# Patient Record
Sex: Female | Born: 1970
Health system: Southern US, Community
[De-identification: ages and names within clinical notes are randomized; demographics above are authoritative.]

---

## 2014-07-08 ENCOUNTER — Ambulatory Visit: Payer: Self-pay | Admitting: Obstetrics and Gynecology

## 2016-06-12 ENCOUNTER — Other Ambulatory Visit: Payer: Self-pay | Admitting: Obstetrics and Gynecology

## 2016-06-12 DIAGNOSIS — F4322 Adjustment disorder with anxiety: Secondary | ICD-10-CM | POA: Diagnosis not present

## 2016-06-12 DIAGNOSIS — Z1231 Encounter for screening mammogram for malignant neoplasm of breast: Secondary | ICD-10-CM

## 2016-06-27 ENCOUNTER — Ambulatory Visit: Admission: RE | Admit: 2016-06-27 | Payer: BLUE CROSS/BLUE SHIELD | Source: Ambulatory Visit

## 2016-07-11 ENCOUNTER — Ambulatory Visit
Admission: RE | Admit: 2016-07-11 | Discharge: 2016-07-11 | Disposition: A | Discharge status: Home / Self Care | Payer: BLUE CROSS/BLUE SHIELD | Source: Ambulatory Visit | Attending: Obstetrics and Gynecology | Admitting: Obstetrics and Gynecology

## 2016-07-11 DIAGNOSIS — Z1231 Encounter for screening mammogram for malignant neoplasm of breast: Secondary | ICD-10-CM | POA: Insufficient documentation

## 2016-07-30 DIAGNOSIS — Z1329 Encounter for screening for other suspected endocrine disorder: Secondary | ICD-10-CM | POA: Diagnosis not present

## 2016-07-30 DIAGNOSIS — Z136 Encounter for screening for cardiovascular disorders: Secondary | ICD-10-CM | POA: Diagnosis not present

## 2016-07-30 DIAGNOSIS — I10 Essential (primary) hypertension: Secondary | ICD-10-CM | POA: Diagnosis not present

## 2016-07-30 DIAGNOSIS — Z13220 Encounter for screening for lipoid disorders: Secondary | ICD-10-CM | POA: Diagnosis not present

## 2016-07-30 DIAGNOSIS — Z131 Encounter for screening for diabetes mellitus: Secondary | ICD-10-CM | POA: Diagnosis not present

## 2017-02-07 DIAGNOSIS — I10 Essential (primary) hypertension: Secondary | ICD-10-CM | POA: Diagnosis not present

## 2017-03-26 ENCOUNTER — Telehealth: Payer: Self-pay

## 2017-03-26 NOTE — Telephone Encounter (Signed)
Taylor Barr DOB: [birth year] called to schedule his son Taylor MeyerMcIntyre "Ty" an appt with Dr. Sullivan LoneGilbert. I advised that I didn't show Ty as a pt and that Mr. Taylor Barr wasn't a current a pt. Mr. Taylor Barr stated that his wife is also a pt of Dr. Elisabeth Barr's Taylor Genera(Taylor Barr DOB: 07/16/1971). Mr. Taylor Barr is requesting that his son become a new pt and be seen today if possible for a sore throat and Mr. Taylor Barr would also like to re-establish himself. Insurance: BCBS. No medical conditions. Ty takes singular and Mr. Taylor Barr doesn't take any medications. Can we see Ty as a new pt today and re-establish Mr. Taylor Barr for another day? Please advise. Thanks Taylor Barr   Please see above. Taylor Barr was last seen in 2014 by you-aa

## 2017-03-26 NOTE — Telephone Encounter (Signed)
Ok for both

## 2017-07-31 DIAGNOSIS — I10 Essential (primary) hypertension: Secondary | ICD-10-CM | POA: Diagnosis not present

## 2017-07-31 DIAGNOSIS — Z1321 Encounter for screening for nutritional disorder: Secondary | ICD-10-CM | POA: Diagnosis not present

## 2017-07-31 DIAGNOSIS — Z124 Encounter for screening for malignant neoplasm of cervix: Secondary | ICD-10-CM | POA: Diagnosis not present

## 2017-07-31 DIAGNOSIS — Z131 Encounter for screening for diabetes mellitus: Secondary | ICD-10-CM | POA: Diagnosis not present

## 2017-07-31 DIAGNOSIS — Z01419 Encounter for gynecological examination (general) (routine) without abnormal findings: Secondary | ICD-10-CM | POA: Diagnosis not present

## 2017-07-31 DIAGNOSIS — Z136 Encounter for screening for cardiovascular disorders: Secondary | ICD-10-CM | POA: Diagnosis not present

## 2017-07-31 DIAGNOSIS — Z13220 Encounter for screening for lipoid disorders: Secondary | ICD-10-CM | POA: Diagnosis not present

## 2017-08-01 ENCOUNTER — Other Ambulatory Visit: Payer: Self-pay | Admitting: Obstetrics and Gynecology

## 2017-08-01 DIAGNOSIS — Z1231 Encounter for screening mammogram for malignant neoplasm of breast: Secondary | ICD-10-CM

## 2017-12-11 IMAGING — MG MM DIGITAL SCREENING BILAT W/ CAD
8 series · 8 of 8 positions shown · non-contrast
Comparison: Previous exam(s).

CLINICAL DATA: Screening.

EXAM:
DIGITAL SCREENING BILATERAL MAMMOGRAM WITH CAD

[L CC (1 of 3)]
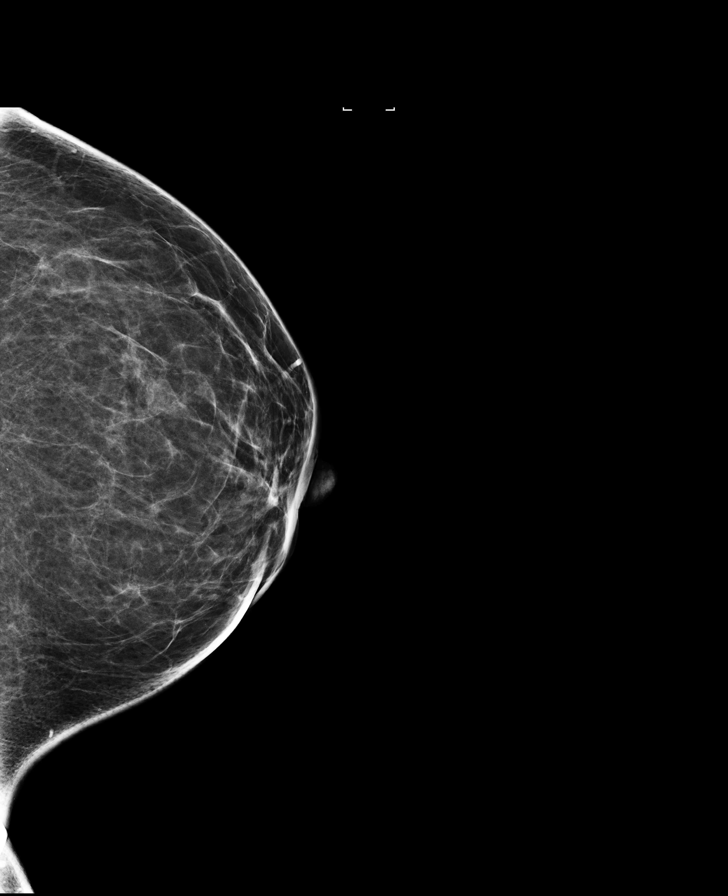

[R CC (1 of 3)]
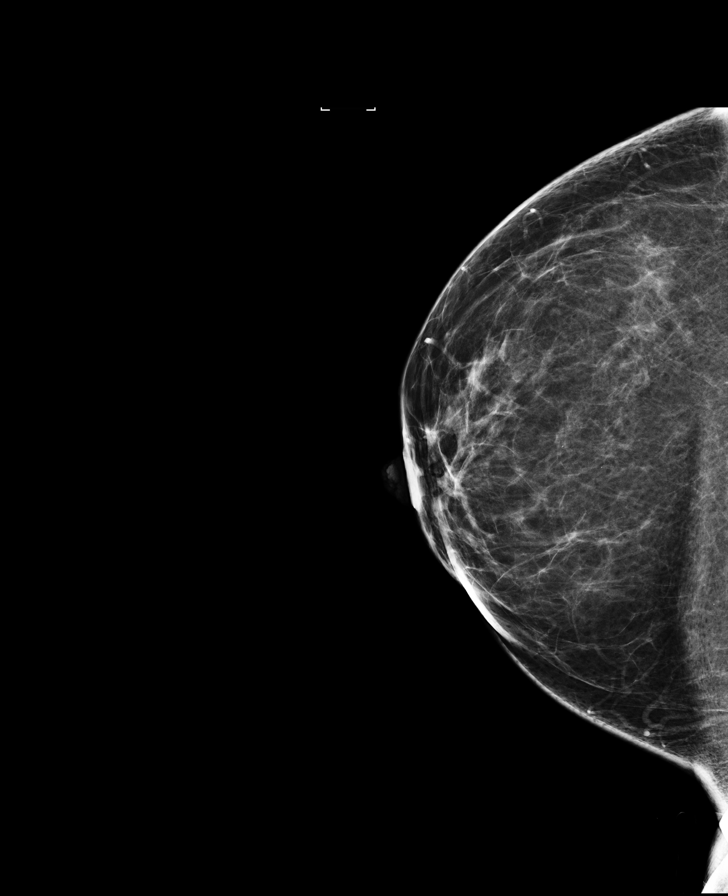

[L CC (2 of 3)]
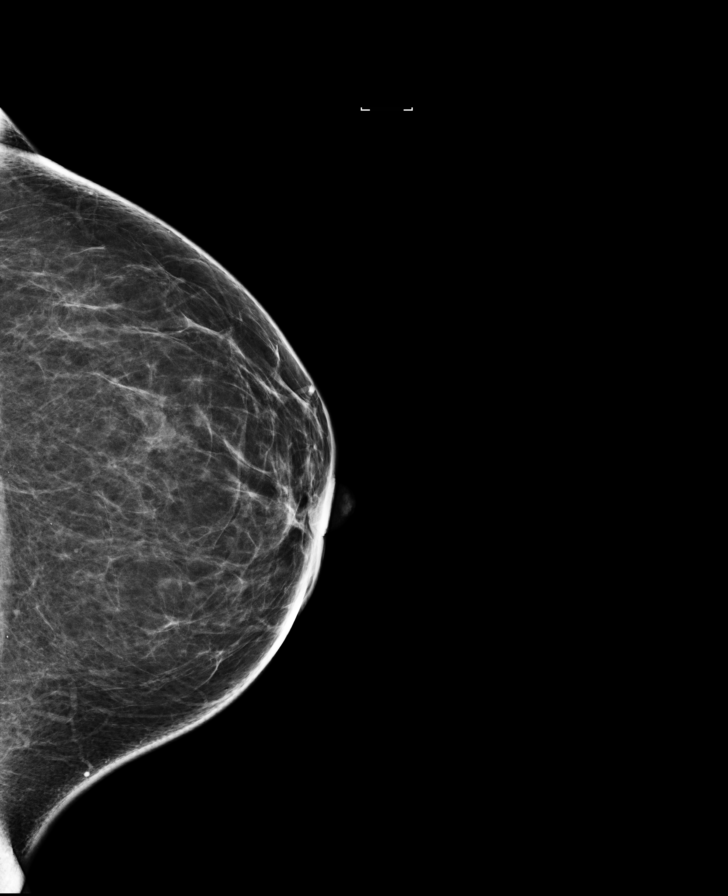

[L MLO]
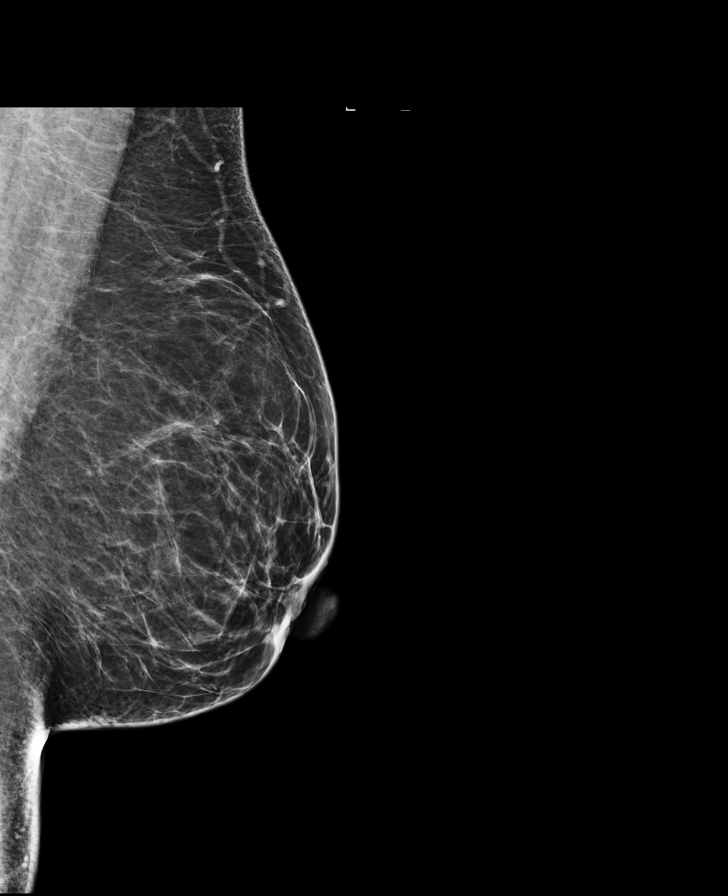

[L CC (3 of 3)]
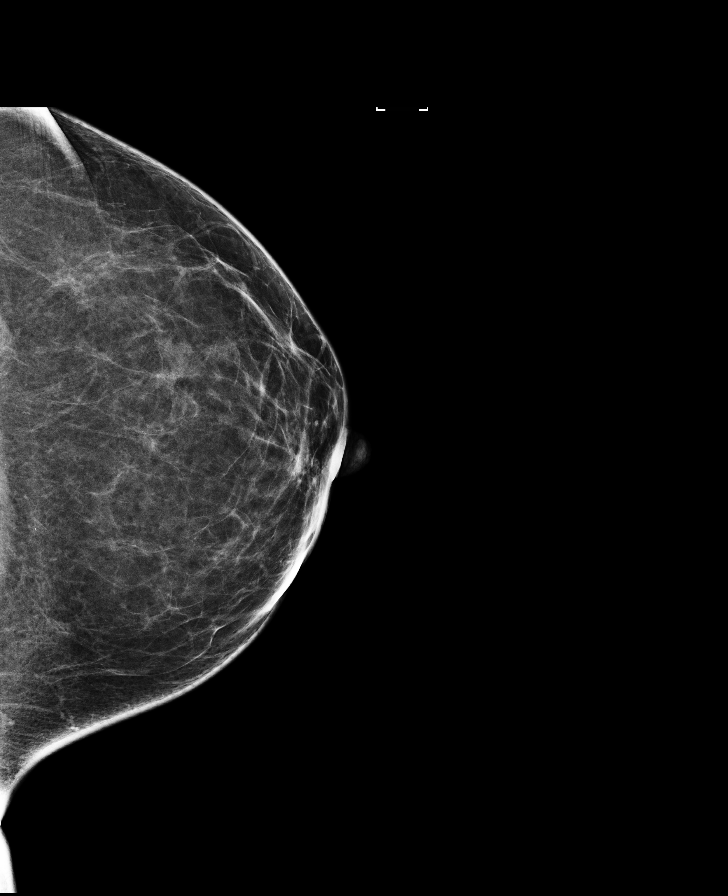

[R CC (2 of 3)]
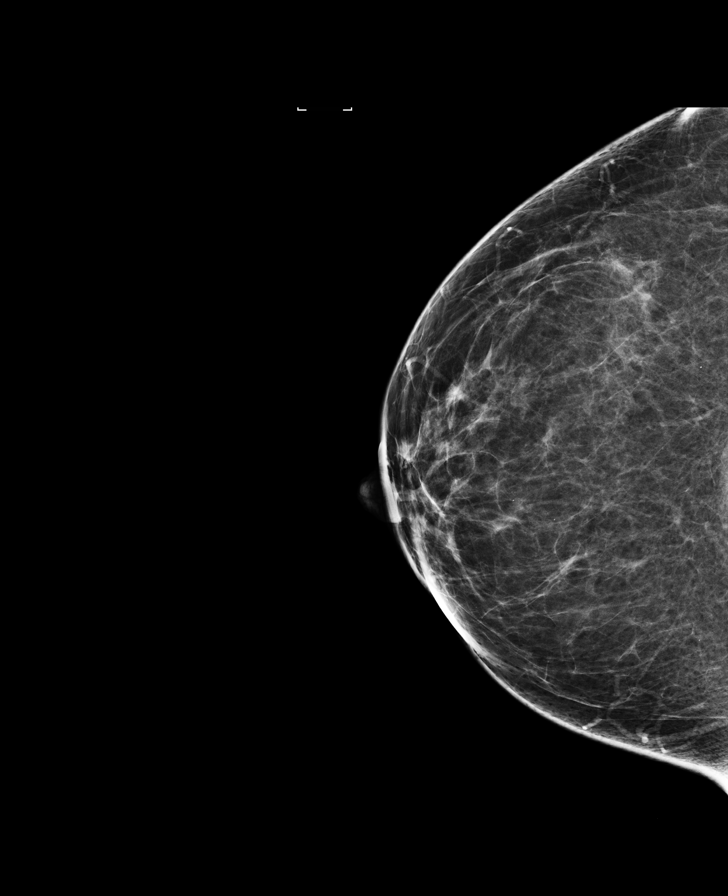

[R CC (3 of 3)]
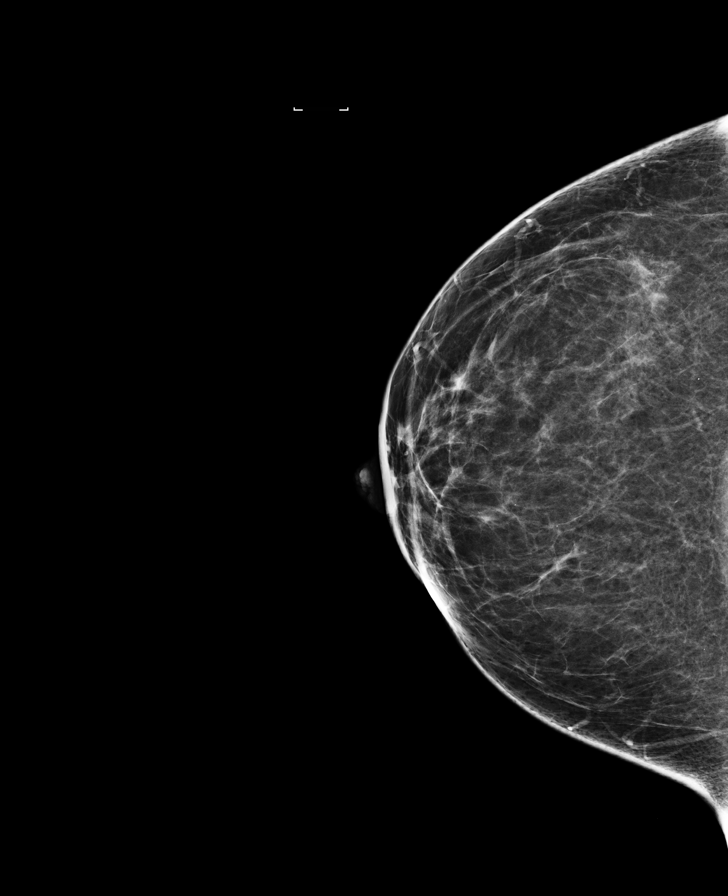

[R MLO]
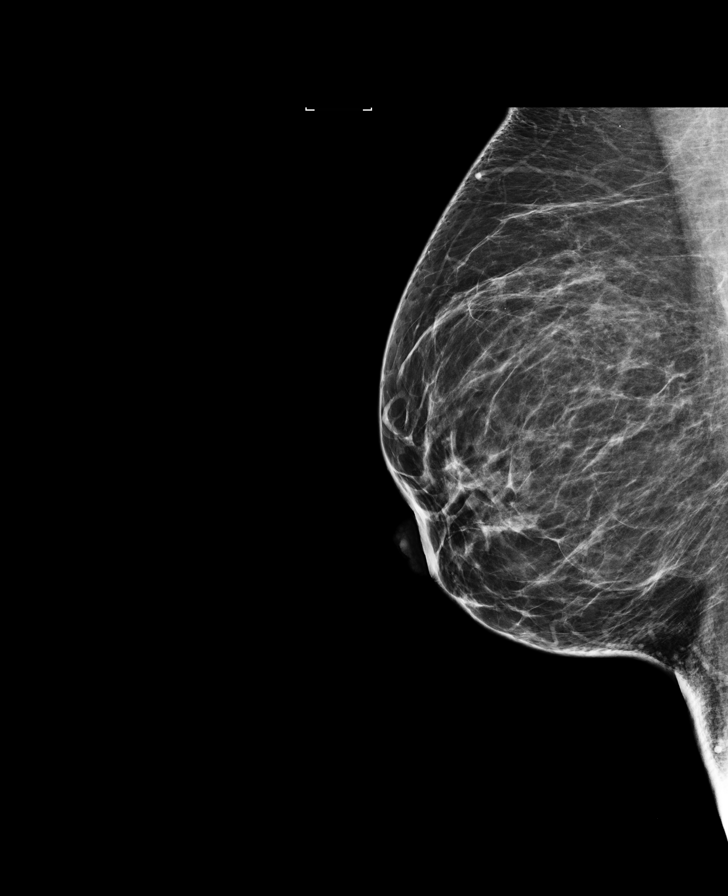

[8 of 8 positions shown; findings below may reference images not displayed]

ACR Breast Density Category b: There are scattered areas of
fibroglandular density.
FINDINGS: There are no findings suspicious for malignancy. Images were
processed with CAD.
IMPRESSION: No mammographic evidence of malignancy. A result letter of this
screening mammogram will be mailed directly to the patient.

RECOMMENDATION:
Screening mammogram in one year. (Code:AS-G-LCT)

BI-RADS CATEGORY  1: Negative.

## 2018-03-17 ENCOUNTER — Ambulatory Visit: Payer: BLUE CROSS/BLUE SHIELD | Admitting: Physician Assistant

## 2018-03-17 DIAGNOSIS — J029 Acute pharyngitis, unspecified: Secondary | ICD-10-CM

## 2018-03-17 NOTE — Patient Instructions (Signed)

## 2018-03-17 NOTE — Progress Notes (Addendum)
       Patient: Taylor Barr Female    DOB: 08/07/1971   [age] y.o.   MRN: [id number] Visit Date: 03/18/2018  Today's Provider: Trey SailorsAdriana M Pollak, PA-C   Chief Complaint  Patient presents with  . Sinusitis   Subjective:    Taylor Barr is a 47 y/o woman presenting today for one day of nasal and sinus congestion. She is a fifth grade teacher. She did not get flu shot. Also reports body aches. Has taken motrin with good relief.  Sinusitis  This is a new problem. The current episode started yesterday. The problem has been gradually worsening since onset. There has been no fever. Associated symptoms include congestion, ear pain, headaches, sinus pressure, a sore throat and swollen glands. Pertinent negatives include no chills, coughing, diaphoresis, hoarse voice, neck pain, shortness of breath or sneezing.       No Known Allergies   Current Outpatient Medications:  .  FLUoxetine (PROZAC) 40 MG capsule, Take 40 mg by mouth daily., Disp: , Rfl:  .  lisinopril-hydrochlorothiazide (PRINZIDE,ZESTORETIC) 20-25 MG tablet, Take 1 tablet by mouth every morning., Disp: , Rfl: 1 .  simvastatin (ZOCOR) 20 MG tablet, , Disp: , Rfl: 0  Review of Systems  Constitutional: Positive for fatigue. Negative for activity change, appetite change, chills, diaphoresis, fever and unexpected weight change.  HENT: Positive for congestion, ear pain, postnasal drip, sinus pressure, sinus pain and sore throat. Negative for ear discharge, hoarse voice, rhinorrhea, sneezing, tinnitus, trouble swallowing and voice change.   Eyes: Positive for discharge and itching. Negative for photophobia, pain, redness and visual disturbance.  Respiratory: Negative.  Negative for cough and shortness of breath.   Gastrointestinal: Negative.   Musculoskeletal: Negative for neck pain.  Neurological: Positive for headaches. Negative for dizziness and light-headedness.    Social History   Tobacco Use  . Smoking status: Not on file    Substance Use Topics  . Alcohol use: Not on file   Objective:   There were no vitals taken for this visit. There were no vitals filed for this visit.   Physical Exam  Constitutional: She is oriented to person, place, and time. She appears well-developed and well-nourished.  HENT:  Right Ear: Tympanic membrane and external ear normal.  Left Ear: Tympanic membrane and external ear normal.  Mouth/Throat: Oropharynx is clear and moist. No oropharyngeal exudate.  Eyes: Conjunctivae are normal.  Cardiovascular: Normal rate and regular rhythm.  Pulmonary/Chest: Effort normal and breath sounds normal.  Lymphadenopathy:    She has cervical adenopathy.  Neurological: She is alert and oriented to person, place, and time.  Skin: Skin is warm and dry.  Psychiatric: She has a normal mood and affect. Her behavior is normal.        Assessment & Plan:     1. Pharyngitis, unspecified etiology  Rapid flu negative. Likely viral in etiology. Counseled on duration of viral illness and indication for antibiotics for bacterial sinusitis. Counseled OTC tx. She may call back on 03/24/2018 if she is worsening for antibiotics.  - POCT Influenza A/B  Return if symptoms worsen or fail to improve.  The entirety of the information documented in the History of Present Illness, Review of Systems and Physical Exam were personally obtained by me. Portions of this information were initially documented by Kavin LeechLaura Walsh, CMA and reviewed by me for thoroughness and accuracy.         Trey SailorsAdriana M Pollak, PA-C  [facility]

## 2018-03-18 LAB — POCT INFLUENZA A/B
Influenza A, POC: NEGATIVE
Influenza B, POC: NEGATIVE

## 2018-03-19 ENCOUNTER — Telehealth: Payer: Self-pay

## 2018-03-19 NOTE — Telephone Encounter (Signed)
I instructed her that viral illness is 10 days. Her symptoms stated Sunday, clinic visit on Monday, and instructed her to call back 03/24/2018. This is still in the viral window and abx is not appropriate.

## 2018-03-19 NOTE — Telephone Encounter (Signed)
Noted, thank you for speaking with this patient.

## 2018-03-19 NOTE — Telephone Encounter (Signed)
Patient advised as directed below. Per patient this is ridiculous and requested to see Dr. Sullivan LoneGilbert.Advised patient Dr. Sullivan LoneGilbert is done this afternoon. 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

## 2018-03-19 NOTE — Telephone Encounter (Signed)
Patient is calling that she saw Adriana on Monday and was told that if she wasn't feeling any better to call back and that she send an antibiotic.Patient reports she is not better. [facility] [place]  Thanks,  -Floy Riegler

## 2018-03-21 DIAGNOSIS — J0140 Acute pansinusitis, unspecified: Secondary | ICD-10-CM | POA: Diagnosis not present

## 2018-05-29 DIAGNOSIS — E559 Vitamin D deficiency, unspecified: Secondary | ICD-10-CM | POA: Diagnosis not present

## 2018-05-29 DIAGNOSIS — F4323 Adjustment disorder with mixed anxiety and depressed mood: Secondary | ICD-10-CM | POA: Diagnosis not present

## 2019-02-03 DIAGNOSIS — Z131 Encounter for screening for diabetes mellitus: Secondary | ICD-10-CM | POA: Diagnosis not present

## 2019-02-03 DIAGNOSIS — Z1321 Encounter for screening for nutritional disorder: Secondary | ICD-10-CM | POA: Diagnosis not present

## 2019-02-03 DIAGNOSIS — Z136 Encounter for screening for cardiovascular disorders: Secondary | ICD-10-CM | POA: Diagnosis not present

## 2019-02-03 DIAGNOSIS — I10 Essential (primary) hypertension: Secondary | ICD-10-CM | POA: Diagnosis not present

## 2019-06-04 DIAGNOSIS — I10 Essential (primary) hypertension: Secondary | ICD-10-CM | POA: Diagnosis not present

## 2019-06-04 DIAGNOSIS — Z131 Encounter for screening for diabetes mellitus: Secondary | ICD-10-CM | POA: Diagnosis not present

## 2019-06-04 DIAGNOSIS — F4323 Adjustment disorder with mixed anxiety and depressed mood: Secondary | ICD-10-CM | POA: Diagnosis not present

## 2019-06-04 DIAGNOSIS — Z1321 Encounter for screening for nutritional disorder: Secondary | ICD-10-CM | POA: Diagnosis not present

## 2019-06-04 DIAGNOSIS — Z136 Encounter for screening for cardiovascular disorders: Secondary | ICD-10-CM | POA: Diagnosis not present

## 2019-07-09 DIAGNOSIS — L0100 Impetigo, unspecified: Secondary | ICD-10-CM | POA: Diagnosis not present

## 2019-09-28 NOTE — Progress Notes (Signed)
Patient: Taylor Barr Female    DOB: Jan 11, 1971   48 y.o.   MRN: [id number] Visit Date: 09/29/2019  Today's Provider: Megan Mans, MD   No chief complaint on file.  Subjective:   HPI Patient comes in today c/o numbness and tingling in both hands for the last week. She has been using wrist braces, ice, and Ibuprofen. LOV with PCP was in 2014.  She sleeps with her hands and arms in an awkward position with them tightly palmar flexed.  She wakes up with her hands numb and even a part of her arms numb.  She states she feels a little weaker in the hands with opening jars but otherwise is not dropping anything.  She points to the index middle and ring finger and the thumb when asked where the numbness is.  She just over the weekend started wearing a splint.  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  Her children are now senior and sophomore in high school.  She and her husband are doing well. No Known Allergies   Current Outpatient Medications:  .  escitalopram (LEXAPRO) 20 MG tablet, Take 20 mg by mouth daily., Disp: , Rfl:  .  lisinopril-hydrochlorothiazide (PRINZIDE,ZESTORETIC) 20-25 MG tablet, Take 1 tablet by mouth every morning., Disp: , Rfl: 1 .  simvastatin (ZOCOR) 20 MG tablet, , Disp: , Rfl: 0 .  FLUoxetine (PROZAC) 40 MG capsule, Take 40 mg by mouth daily., Disp: , Rfl:   Review of Systems  Constitutional: Negative.   Respiratory: Negative.   Cardiovascular: Negative.   Gastrointestinal: Negative.   Allergic/Immunologic: Negative.   Neurological:       Numbness and tingling in hands.  Hematological: Negative.   Psychiatric/Behavioral: Negative.     Social History   Tobacco Use  . Smoking status: Not on file  Substance Use Topics  . Alcohol use: Not on file      Objective:   BP 128/80 (BP Location: Right Arm, Patient Position: Sitting, Cuff Size: Large)   Pulse 98   Temp  (!) 96.6 F (35.9 C) (Temporal)   Resp 18   Wt 215 lb (97.5 kg)   LMP 09/10/2019 (LMP Unknown)   SpO2 97%  Vitals:   09/29/19 0828  BP: 128/80  Pulse: 98  Resp: 18  Temp: (!) 96.6 F (35.9 C)  TempSrc: Temporal  SpO2: 97%  Weight: 215 lb (97.5 kg)  There is no height or weight on file to calculate BMI.   Physical Exam Vitals signs reviewed.  Constitutional:      Appearance: She is obese.  HENT:     Head: Normocephalic and atraumatic.     Right Ear: External ear normal.     Left Ear: External ear normal.  Cardiovascular:     Rate and Rhythm: Normal rate and regular rhythm.     Heart sounds: Normal heart sounds.  Pulmonary:     Effort: Pulmonary effort is normal.  Neurological:     Mental Status: She is alert and oriented to person, place, and time.     Comments: Positive Tinel sign bilaterally.  Grip is normal bilaterally  Psychiatric:        Mood and Affect: Mood normal.        Behavior: Behavior normal.        Thought Content: Thought content normal.        Judgment: Judgment normal.  No results found for any visits on 09/29/19.     Assessment & Plan    1. Carpal tunnel syndrome of right wrist  - naproxen (NAPROSYN) 500 MG tablet; Take 1 tablet (500 mg total) by mouth 2 (two) times daily with a meal.  Dispense: 30 tablet; Refill: 0  2. Carpal tunnel syndrome on both sides Right greater than left.  Use cock-up wrist splint most of the 24 hours/day.  Try naproxen 500 mg twice daily and recheck in 3 weeks.  Take this with food.  Stop over-the-counter ibuprofen.  May take as needed Tylenol.  May need referral to hand surgery.      Cranford Mon, MD  [facility]

## 2019-09-29 ENCOUNTER — Ambulatory Visit: Payer: BC Managed Care – PPO | Admitting: Family Medicine

## 2019-09-29 ENCOUNTER — Other Ambulatory Visit: Payer: Self-pay

## 2019-09-29 VITALS — BP 128/80 | HR 98 | Temp 96.6°F | Resp 18 | Wt 215.0 lb

## 2019-09-29 DIAGNOSIS — G5603 Carpal tunnel syndrome, bilateral upper limbs: Secondary | ICD-10-CM | POA: Diagnosis not present

## 2019-09-29 DIAGNOSIS — G5601 Carpal tunnel syndrome, right upper limb: Secondary | ICD-10-CM | POA: Diagnosis not present

## 2019-09-29 MED ORDER — NAPROXEN 500 MG PO TABS: 500.0000 mg | ORAL_TABLET | Freq: Two times a day (BID) | ORAL | 0 refills | Status: AC

## 2019-09-29 NOTE — Patient Instructions (Signed)
Wear cock up wrist splints every night. Do not get wet.

## 2019-10-19 DIAGNOSIS — Z20828 Contact with and (suspected) exposure to other viral communicable diseases: Secondary | ICD-10-CM | POA: Diagnosis not present

## 2019-10-29 ENCOUNTER — Ambulatory Visit: Payer: Self-pay | Admitting: Family Medicine

## 2019-12-16 DIAGNOSIS — F4323 Adjustment disorder with mixed anxiety and depressed mood: Secondary | ICD-10-CM | POA: Diagnosis not present

## 2020-01-21 DIAGNOSIS — Z20828 Contact with and (suspected) exposure to other viral communicable diseases: Secondary | ICD-10-CM | POA: Diagnosis not present

## 2020-03-03 DIAGNOSIS — Z03818 Encounter for observation for suspected exposure to other biological agents ruled out: Secondary | ICD-10-CM | POA: Diagnosis not present

## 2020-06-21 DIAGNOSIS — Z01419 Encounter for gynecological examination (general) (routine) without abnormal findings: Secondary | ICD-10-CM | POA: Diagnosis not present

## 2020-11-10 ENCOUNTER — Encounter: Payer: Self-pay | Admitting: Adult Health

## 2020-11-10 ENCOUNTER — Ambulatory Visit (INDEPENDENT_AMBULATORY_CARE_PROVIDER_SITE_OTHER): Payer: BC Managed Care – PPO | Admitting: Adult Health

## 2020-11-10 DIAGNOSIS — Z20818 Contact with and (suspected) exposure to other bacterial communicable diseases: Secondary | ICD-10-CM | POA: Diagnosis not present

## 2020-11-10 DIAGNOSIS — J029 Acute pharyngitis, unspecified: Secondary | ICD-10-CM | POA: Diagnosis not present

## 2020-11-10 DIAGNOSIS — R070 Pain in throat: Secondary | ICD-10-CM

## 2020-11-10 DIAGNOSIS — R599 Enlarged lymph nodes, unspecified: Secondary | ICD-10-CM

## 2020-11-10 MED ORDER — AMOXICILLIN 875 MG PO TABS: 875.0000 mg | ORAL_TABLET | Freq: Two times a day (BID) | ORAL | 0 refills | Status: DC

## 2020-11-10 MED ORDER — PREDNISONE 10 MG (21) PO TBPK: ORAL_TABLET | ORAL | 0 refills | Status: DC

## 2020-11-10 NOTE — Progress Notes (Signed)
Virtual telephone visit    Virtual Visit via Telephone Note   This visit type was conducted due to national recommendations for restrictions regarding the COVID-19 Pandemic (e.g. social distancing) in an effort to limit this patient's exposure and mitigate transmission in our community. Due to her co-morbid illnesses, this patient is at least at moderate risk for complications without adequate follow up. This format is felt to be most appropriate for this patient at this time. The patient did not have access to video technology or had technical difficulties with video requiring transitioning to audio format only (telephone). Physical exam was limited to content and character of the telephone converstion.    Patient location: at home  Provider location: Provider: Provider's office at  [facility], [place].     I discussed the limitations of evaluation and management by telemedicine and the availability of in person appointments. The patient expressed understanding and agreed to proceed.   Visit Date: 11/10/2020  Today's healthcare provider: Jairo Ben, FNP   Subjective      Chief complaint : bad sore throat.  Nose feels stuffy but no congestion or drainage. " lymp node seems swollen on right side. Denies any  erythema or rash. Painful swallowing. Able to swallow liquids and soft foods.   Onset 11/ 06/2020 She has been out of town. She had has sore throat, eye burning, nasal stuffiness" kind of"  no drainage.  She has a dry cough occasionally coming from her throat she reports.. She denies nausea. Sore throat is patients worse symptom. Denies history of strep throat.  Fatigue. Denies fever, she is taking tylenol and motrin. Denies chills.   She is vaccinated for covid.  Denies loss of taste and or smell.   Her daughter was sick last week and tested for positive for strep last week, she is a Printmaker in college and came home sick, she was taking care  of her daughter. She tested negative for covid.    Patient  denies any fever, body aches,chills, rash, chest pain, shortness of breath, nausea, vomiting, or diarrhea.  Denies dizziness, lightheadedness, pre syncopal or syncopal episodes.   Patient's last menstrual period was 11/03/2020 (exact date).   Social History   Socioeconomic History  . Marital status: Married    Spouse name: Not on file  . Number of children: Not on file  . Years of education: Not on file  . Highest education level: Not on file  Occupational History  . Not on file  Tobacco Use  . Smoking status: Not on file  Substance and Sexual Activity  . Alcohol use: Not on file  . Drug use: Not on file  . Sexual activity: Not on file  Other Topics Concern  . Not on file  Social History Narrative  . Not on file   Social Determinants of Health   Financial Resource Strain:   . Difficulty of Paying Living Expenses: Not on file  Food Insecurity:   . Worried About Programme researcher, broadcasting/film/video in the Last Year: Not on file  . Ran Out of Food in the Last Year: Not on file  Transportation Needs:   . Lack of Transportation (Medical): Not on file  . Lack of Transportation (Non-Medical): Not on file  Physical Activity:   . Days of Exercise per Week: Not on file  . Minutes of Exercise per Session: Not on file  Stress:   . Feeling of Stress : Not on file  Social  Connections:   . Frequency of Communication with Friends and Family: Not on file  . Frequency of Social Gatherings with Friends and Family: Not on file  . Attends Religious Services: Not on file  . Active Member of Clubs or Organizations: Not on file  . Attends Banker Meetings: Not on file  . Marital Status: Not on file  Intimate Partner Violence:   . Fear of Current or Ex-Partner: Not on file  . Emotionally Abused: Not on file  . Physically Abused: Not on file  . Sexually Abused: Not on file   No Known Allergies    Medications: Outpatient  Medications Prior to Visit  Medication Sig  . escitalopram (LEXAPRO) 20 MG tablet Take 20 mg by mouth daily.  Marland Kitchen FLUoxetine (PROZAC) 40 MG capsule Take 40 mg by mouth daily.  Marland Kitchen lisinopril-hydrochlorothiazide (PRINZIDE,ZESTORETIC) 20-25 MG tablet Take 1 tablet by mouth every morning.  . naproxen (NAPROSYN) 500 MG tablet Take 1 tablet (500 mg total) by mouth 2 (two) times daily with a meal.  . simvastatin (ZOCOR) 20 MG tablet    No facility-administered medications prior to visit.    Review of Systems  Constitutional: Positive for fatigue. Negative for activity change, appetite change, chills, diaphoresis, fever and unexpected weight change.  HENT: Positive for sore throat. Negative for congestion, dental problem, drooling, ear discharge, ear pain, facial swelling, hearing loss, hoarse voice, mouth sores, nosebleeds, postnasal drip, rhinorrhea, sinus pressure, sinus pain, sneezing, tinnitus, trouble swallowing and voice change.   Respiratory: Positive for cough (occasional from throat ). Negative for apnea, choking, chest tightness, shortness of breath, wheezing and stridor.   Cardiovascular: Negative.   Gastrointestinal: Negative.   Genitourinary: Negative.   Musculoskeletal: Negative for arthralgias, back pain, gait problem, joint swelling, myalgias, neck pain and neck stiffness.  Skin: Negative.   Neurological: Positive for headaches. Negative for dizziness, tremors, seizures, syncope, facial asymmetry, speech difficulty, weakness, light-headedness and numbness.  Psychiatric/Behavioral: Negative.     Last CBC No results found for: WBC, HGB, HCT, MCV, MCH, RDW, PLT Last metabolic panel No results found for: GLUCOSE, NA, K, CL, CO2, BUN, CREATININE, GFRNONAA, GFRAA, CALCIUM, PHOS, PROT, ALBUMIN, LABGLOB, AGRATIO, BILITOT, ALKPHOS, AST, ALT, ANIONGAP    Objective    LMP 11/03/2020 (Exact Date)  BP Readings from Last 3 Encounters:  09/29/19 128/80   Wt Readings from Last 3 Encounters:   09/29/19 215 lb (97.5 kg)     Patient is alert and oriented and responsive to questions Engages in conversation with provider. Speaks in full sentences without any pauses without any shortness of breath or distress.    Assessment & Plan     Sorethroat - Plan: Novel Coronavirus, NAA ([facility]), Culture, Group A Strep  Exposure to strep throat - Plan: amoxicillin (AMOXIL) 875 MG tablet  Throat pain in adult  Glands swollen - Plan: predniSONE (STERAPRED UNI-PAK 21 TAB) 10 MG (21) TBPK tablet  Pharyngitis, unspecified etiology - Plan: amoxicillin (AMOXIL) 875 MG tablet  Meds ordered this encounter  Medications  . predniSONE (STERAPRED UNI-PAK 21 TAB) 10 MG (21) TBPK tablet    Sig: PO: Take 6 tablets on day 1:Take 5 tablets day 2:Take 4 tablets day 3: Take 3 tablets day 4:Take 2 tablets day five: 5 Take 1 tablet day 6    Dispense:  21 tablet    Refill:  0  . amoxicillin (AMOXIL) 875 MG tablet    Sig: Take 1 tablet (875 mg total) by mouth 2 (two) times  daily.    Dispense:  20 tablet    Refill:  0   Given exposure to strep in household, will test, she requests to go ahead and start medication, ok with this given the severity of her throat pain.  Red Flags discussed. The patient was given clear instructions to go to ER or return to medical center if any red flags develop, symptoms do not improve, worsen or new problems develop. They verbalized understanding.   Return in about 4 days (around 11/14/2020), or if symptoms worsen or fail to improve, for at any time for any worsening symptoms, Go to Emergency room/ urgent care if worse.    I discussed the assessment and treatment plan with the patient. The patient was provided an opportunity to ask questions and all were answered. The patient agreed with the plan and demonstrated an understanding of the instructions.   The patient was advised to call back or seek an in-person evaluation if the symptoms worsen or if the condition fails to  improve as anticipated.  . I provided 20 minutes of non-face-to-face time during this encounter.  I discussed the limitations of evaluation and management by telemedicine and the availability of in person appointments. The patient expressed understanding and agreed to proceed.  Jairo Ben, FNP [facility] [phone] (phone) [phone] (fax)  [facility]

## 2020-11-10 NOTE — Patient Instructions (Signed)
Amoxicillin capsules or tablets What is this medicine? AMOXICILLIN (a mox i SIL in) is a penicillin antibiotic. It is used to treat certain kinds of bacterial infections. It will not work for colds, flu, or other viral infections. This medicine may be used for other purposes; ask your health care provider or pharmacist if you have questions. COMMON BRAND NAME(S): Amoxil, Moxilin, Sumox, Trimox What should I tell my health care provider before I take this medicine? They need to know if you have any of these conditions:  kidney disease  an unusual or allergic reaction to amoxicillin, other penicillins, cephalosporin antibiotics, other medicines, foods, dyes, or preservatives  pregnant or trying to get pregnant  breast-feeding How should I use this medicine? Take this medicine by mouth with a glass of water. Follow the directions on your prescription label. You can take it with or without food. If it upsets your stomach, take it with food. Take your medicine at regular intervals. Do not take your medicine more often than directed. Take all of your medicine as directed even if you think you are better. Do not skip doses or stop your medicine early. Talk to your pediatrician regarding the use of this medicine in children. While this drug may be prescribed for selected conditions, precautions do apply. Overdosage: If you think you have taken too much of this medicine contact a poison control center or emergency room at once. NOTE: This medicine is only for you. Do not share this medicine with others. What if I miss a dose? If you miss a dose, take it as soon as you can. If it is almost time for your next dose, take only that dose. Do not take double or extra doses. What may interact with this medicine?  allopurinol  birth control pills  certain antibiotics like chloramphenicol, erythromycin, sulfamethoxazole, tetracycline  certain medicines that treat or prevent blood clots like  warfarin This list may not describe all possible interactions. Give your health care provider a list of all the medicines, herbs, non-prescription drugs, or dietary supplements you use. Also tell them if you smoke, drink alcohol, or use illegal drugs. Some items may interact with your medicine. What should I watch for while using this medicine? Tell your health care professional if your symptoms do not start to get better or if they get worse. Do not treat diarrhea with over the counter products. Contact your health care professional if you have diarrhea that lasts more than 2 days or if it is severe and watery. If you have diabetes, you may get a false-positive result for sugar in your urine. Check with your health care professional. Birth control may not work properly while you are taking this medicine. Talk to your health care professional about using an extra method of birth control. This medicine may cause serious skin reactions. They can happen weeks to months after starting the medicine. Contact your health care provider right away if you notice fevers or flu-like symptoms with a rash. The rash may be red or purple and then turn into blisters or peeling of the skin. Or, you might notice a red rash with swelling of the face, lips or lymph nodes in your neck or under your arms. What side effects may I notice from receiving this medicine? Side effects that you should report to your doctor or health care professional as soon as possible:  allergic reactions like skin rash, itching or hives, swelling of the face, lips, or tongue  bloody or watery  diarrhea  breathing problems  feeling faint; lightheaded, falls  fever  redness, blistering, peeling or loosening of the skin, including inside the mouth  seizures  signs and symptoms of kidney injury like trouble passing urine or change in the amount of urine  signs and symptoms of liver injury like dark yellow or brown urine; general ill  feeling or flu-like symptoms; light-colored stools; loss of appetite; nausea; right upper belly pain; unusually weak or tired; yellowing of the eyes or skin  unusual bleeding or bruising  unusually weak or tired Side effects that usually do not require medical attention (report to your doctor or health care professional if they continue or are bothersome):  anxious  confusion  diarrhea  dizziness  headache  nausea, vomiting  stomach upset  trouble sleeping This list may not describe all possible side effects. Call your doctor for medical advice about side effects. You may report side effects to FDA at 1-800-FDA-1088. Where should I keep my medicine? Keep out of the reach of children. Store at room temperature between 20 and 25 degrees C (68 and 77 degrees F). Throw away any unused medicine after the expiration date. NOTE: This sheet is a summary. It may not cover all possible information. If you have questions about this medicine, talk to your doctor, pharmacist, or health care provider.  2020 Elsevier/Gold Standard (2019-02-27 14:32:29) Pharyngitis  Pharyngitis is a sore throat (pharynx). This is when there is redness, pain, and swelling in your throat. Most of the time, this condition gets better on its own. In some cases, you may need medicine. Follow these instructions at home:  Take over-the-counter and prescription medicines only as told by your doctor. ? If you were prescribed an antibiotic medicine, take it as told by your doctor. Do not stop taking the antibiotic even if you start to feel better. ? Do not give children aspirin. Aspirin has been linked to Reye syndrome.  Drink enough water and fluids to keep your pee (urine) clear or pale yellow.  Get a lot of rest.  Rinse your mouth (gargle) with a salt-water mixture 3-4 times a day or as needed. To make a salt-water mixture, completely dissolve -1 tsp of salt in 1 cup of warm water.  If your doctor approves, you  may use throat lozenges or sprays to soothe your throat. Contact a doctor if:  You have large, tender lumps in your neck.  You have a rash.  You cough up green, yellow-brown, or bloody spit. Get help right away if:  You have a stiff neck.  You drool or cannot swallow liquids.  You cannot drink or take medicines without throwing up.  You have very bad pain that does not go away with medicine.  You have problems breathing, and it is not from a stuffy nose.  You have new pain and swelling in your knees, ankles, wrists, or elbows. Summary  Pharyngitis is a sore throat (pharynx). This is when there is redness, pain, and swelling in your throat.  If you were prescribed an antibiotic medicine, take it as told by your doctor. Do not stop taking the antibiotic even if you start to feel better.  Most of the time, pharyngitis gets better on its own. Sometimes, you may need medicine. This information is not intended to replace advice given to you by your health care provider. Make sure you discuss any questions you have with your health care provider. Document Revised: 11/29/2017 Document Reviewed: 01/22/2017 Elsevier Patient Education  2020 [employer]  

## 2020-11-12 LAB — NOVEL CORONAVIRUS, NAA: SARS-CoV-2, NAA: NOT DETECTED

## 2020-11-12 LAB — SARS-COV-2, NAA 2 DAY TAT

## 2020-11-13 LAB — CULTURE, GROUP A STREP: Strep A Culture: NEGATIVE

## 2020-11-14 ENCOUNTER — Telehealth: Payer: Self-pay

## 2020-11-14 NOTE — Telephone Encounter (Signed)
Copied from CRM [id number]. Topic: General - Other >> Nov 14, 2020  8:31 AM Lyn Hollingshead D wrote: PT calling to let Clovis Riley know this medication is not working / amoxicillin (AMOXIL) 875 MG tablet [[id number]] / She requesting a new medication / please advise

## 2020-11-14 NOTE — Telephone Encounter (Signed)
Noted  

## 2020-11-14 NOTE — Telephone Encounter (Signed)
Arranged appt to be seen by you in AM. Taylor Barr

## 2020-11-14 NOTE — Progress Notes (Signed)
Her strep test is negative.  Covid negative.  Stop antibiotic is advised, follow up if symptoms worsening or not improving.

## 2020-11-14 NOTE — Telephone Encounter (Signed)
See lab note, strep was negative, covid negative. Ok to stop Augmentin, likely viral Follow up if any symptoms worsening, changing or persist longer than 10  to 14 days or worse at anytime please be seen in person.

## 2020-11-15 ENCOUNTER — Other Ambulatory Visit: Payer: Self-pay

## 2020-11-15 ENCOUNTER — Encounter: Payer: Self-pay | Admitting: Adult Health

## 2020-11-15 ENCOUNTER — Ambulatory Visit: Payer: Self-pay | Admitting: Adult Health

## 2020-11-15 ENCOUNTER — Telehealth (INDEPENDENT_AMBULATORY_CARE_PROVIDER_SITE_OTHER): Payer: BC Managed Care – PPO | Admitting: Adult Health

## 2020-11-15 DIAGNOSIS — J019 Acute sinusitis, unspecified: Secondary | ICD-10-CM | POA: Insufficient documentation

## 2020-11-15 DIAGNOSIS — R059 Cough, unspecified: Secondary | ICD-10-CM | POA: Insufficient documentation

## 2020-11-15 DIAGNOSIS — J3489 Other specified disorders of nose and nasal sinuses: Secondary | ICD-10-CM | POA: Insufficient documentation

## 2020-11-15 DIAGNOSIS — J40 Bronchitis, not specified as acute or chronic: Secondary | ICD-10-CM | POA: Diagnosis not present

## 2020-11-15 NOTE — Patient Instructions (Signed)
Amoxicillin; Clavulanic Acid Tablets What is this medicine? AMOXICILLIN; CLAVULANIC ACID (a mox i SIL in; KLAV yoo lan ic AS id) is a penicillin antibiotic. It treats some infections caused by bacteria. It will not work for colds, the flu, or other viruses. This medicine may be used for other purposes; ask your health care provider or pharmacist if you have questions. COMMON BRAND NAME(S): Augmentin What should I tell my health care provider before I take this medicine? They need to know if you have any of these conditions:  bowel disease, like colitis  kidney disease  liver disease  mononucleosis  an unusual or allergic reaction to amoxicillin, penicillin, cephalosporin, other antibiotics, clavulanic acid, other medicines, foods, dyes, or preservatives  pregnant or trying to get pregnant  breast-feeding How should I use this medicine? Take this drug by mouth. Take it as directed on the prescription label at the same time every day. Take it with food at the start of a meal or snack. Take all of this drug unless your health care provider tells you to stop it early. Keep taking it even if you think you are better. Talk to your health care provider about the use of this drug in children. While it may be prescribed for selected conditions, precautions do apply. Overdosage: If you think you have taken too much of this medicine contact a poison control center or emergency room at once. NOTE: This medicine is only for you. Do not share this medicine with others. What if I miss a dose? If you miss a dose, take it as soon as you can. If it is almost time for your next dose, take only that dose. Do not take double or extra doses. What may interact with this medicine?  allopurinol  anticoagulants  birth control pills  methotrexate  probenecid This list may not describe all possible interactions. Give your health care provider a list of all the medicines, herbs, non-prescription drugs, or  dietary supplements you use. Also tell them if you smoke, drink alcohol, or use illegal drugs. Some items may interact with your medicine. What should I watch for while using this medicine? Tell your doctor or healthcare provider if your symptoms do not improve. This medicine may cause serious skin reactions. They can happen weeks to months after starting the medicine. Contact your healthcare provider right away if you notice fevers or flu-like symptoms with a rash. The rash may be red or purple and then turn into blisters or peeling of the skin. Or, you might notice a red rash with swelling of the face, lips or lymph nodes in your neck or under your arms. Do not treat diarrhea with over the counter products. Contact your doctor if you have diarrhea that lasts more than 2 days or if it is severe and watery. If you have diabetes, you may get a false-positive result for sugar in your urine. Check with your doctor or healthcare provider. Birth control pills may not work properly while you are taking this medicine. Talk to your doctor about using an extra method of birth control. What side effects may I notice from receiving this medicine? Side effects that you should report to your doctor or health care professional as soon as possible:  allergic reactions like skin rash, itching or hives, swelling of the face, lips, or tongue  breathing problems  dark urine  fever or chills, sore throat  redness, blistering, peeling, or loosening of the skin, including inside the mouth  seizures  trouble passing urine or change in the amount of urine  unusual bleeding, bruising  unusually weak or tired  white patches or sores in the mouth or throat Side effects that usually do not require medical attention (report to your doctor or health care professional if they continue or are bothersome):  diarrhea  dizziness  headache  nausea, vomiting  stomach upset  vaginal or anal irritation This list  may not describe all possible side effects. Call your doctor for medical advice about side effects. You may report side effects to FDA at 1-800-FDA-1088. Where should I keep my medicine? Keep out of the reach of children and pets. Store at room temperature between 20 and 25 degrees C (68 and 77 degrees F). Throw away any unused drug after the expiration date. NOTE: This sheet is a summary. It may not cover all possible information. If you have questions about this medicine, talk to your doctor, pharmacist, or health care provider.  2020 Elsevier/Gold Standard (2019-07-20 11:55:53) Prednisone tablets What is this medicine? PREDNISONE (PRED ni sone) is a corticosteroid. It is commonly used to treat inflammation of the skin, joints, lungs, and other organs. Common conditions treated include asthma, allergies, and arthritis. It is also used for other conditions, such as blood disorders and diseases of the adrenal glands. This medicine may be used for other purposes; ask your health care provider or pharmacist if you have questions. COMMON BRAND NAME(S): Deltasone, Predone, Sterapred, Sterapred DS What should I tell my health care provider before I take this medicine? They need to know if you have any of these conditions:  Cushing's syndrome  diabetes  glaucoma  heart disease  high blood pressure  infection (especially a virus infection such as chickenpox, cold sores, or herpes)  kidney disease  liver disease  mental illness  myasthenia gravis  osteoporosis  seizures  stomach or intestine problems  thyroid disease  an unusual or allergic reaction to lactose, prednisone, other medicines, foods, dyes, or preservatives  pregnant or trying to get pregnant  breast-feeding How should I use this medicine? Take this medicine by mouth with a glass of water. Follow the directions on the prescription label. Take this medicine with food. If you are taking this medicine once a day,  take it in the morning. Do not take more medicine than you are told to take. Do not suddenly stop taking your medicine because you may develop a severe reaction. Your doctor will tell you how much medicine to take. If your doctor wants you to stop the medicine, the dose may be slowly lowered over time to avoid any side effects. Talk to your pediatrician regarding the use of this medicine in children. Special care may be needed. Overdosage: If you think you have taken too much of this medicine contact a poison control center or emergency room at once. NOTE: This medicine is only for you. Do not share this medicine with others. What if I miss a dose? If you miss a dose, take it as soon as you can. If it is almost time for your next dose, talk to your doctor or health care professional. You may need to miss a dose or take an extra dose. Do not take double or extra doses without advice. What may interact with this medicine? Do not take this medicine with any of the following medications:  metyrapone  mifepristone This medicine may also interact with the following medications:  aminoglutethimide  amphotericin B  aspirin and aspirin-like medicines  barbiturates  certain medicines for diabetes, like glipizide or glyburide  cholestyramine  cholinesterase inhibitors  cyclosporine  digoxin  diuretics  ephedrine  female hormones, like estrogens and birth control pills  isoniazid  ketoconazole  NSAIDS, medicines for pain and inflammation, like ibuprofen or naproxen  phenytoin  rifampin  toxoids  vaccines  warfarin This list may not describe all possible interactions. Give your health care provider a list of all the medicines, herbs, non-prescription drugs, or dietary supplements you use. Also tell them if you smoke, drink alcohol, or use illegal drugs. Some items may interact with your medicine. What should I watch for while using this medicine? Visit your doctor or health  care professional for regular checks on your progress. If you are taking this medicine over a prolonged period, carry an identification card with your name and address, the type and dose of your medicine, and your doctor's name and address. This medicine may increase your risk of getting an infection. Tell your doctor or health care professional if you are around anyone with measles or chickenpox, or if you develop sores or blisters that do not heal properly. If you are going to have surgery, tell your doctor or health care professional that you have taken this medicine within the last twelve months. Ask your doctor or health care professional about your diet. You may need to lower the amount of salt you eat. This medicine may increase blood sugar. Ask your healthcare provider if changes in diet or medicines are needed if you have diabetes. What side effects may I notice from receiving this medicine? Side effects that you should report to your doctor or health care professional as soon as possible:  allergic reactions like skin rash, itching or hives, swelling of the face, lips, or tongue  changes in emotions or moods  changes in vision  depressed mood  eye pain  fever or chills, cough, sore throat, pain or difficulty passing urine  signs and symptoms of high blood sugar such as being more thirsty or hungry or having to urinate more than normal. You may also feel very tired or have blurry vision.  swelling of ankles, feet Side effects that usually do not require medical attention (report to your doctor or health care professional if they continue or are bothersome):  confusion, excitement, restlessness  headache  nausea, vomiting  skin problems, acne, thin and shiny skin  trouble sleeping  weight gain This list may not describe all possible side effects. Call your doctor for medical advice about side effects. You may report side effects to FDA at 1-800-FDA-1088. Where should I  keep my medicine? Keep out of the reach of children. Store at room temperature between 15 and 30 degrees C (59 and 86 degrees F). Protect from light. Keep container tightly closed. Throw away any unused medicine after the expiration date. NOTE: This sheet is a summary. It may not cover all possible information. If you have questions about this medicine, talk to your doctor, pharmacist, or health care provider.  2020 Elsevier/Gold Standard (2018-09-16 10:54:22) Acute Bronchitis, Adult  Acute bronchitis is when air tubes in the lungs (bronchi) suddenly get swollen. The condition can make it hard for you to breathe. In adults, acute bronchitis usually goes away within 2 weeks. A cough caused by bronchitis may last up to 3 weeks. Smoking, allergies, and asthma can make the condition worse. What are the causes? This condition is caused by:  Cold and flu viruses. The most common cause  of this condition is the virus that causes the common cold.  Bacteria.  Substances that irritate the lungs, including: ? Smoke from cigarettes and other types of tobacco. ? Dust and pollen. ? Fumes from chemicals, gases, or burned fuel. ? Other materials that pollute indoor or outdoor air.  Close contact with someone who has acute bronchitis. What increases the risk? The following factors may make you more likely to develop this condition:  A weak body's defense system. This is also called the immune system.  Any condition that affects your lungs and breathing, such as asthma. What are the signs or symptoms? Symptoms of this condition include:  A cough.  Coughing up clear, yellow, or green mucus.  Wheezing.  Chest congestion.  Shortness of breath.  A fever.  Body aches.  Chills.  A sore throat. How is this treated? Acute bronchitis may go away over time without treatment. Your doctor may recommend:  Drinking more fluids.  Taking a medicine for a fever or cough.  Using a device that gets  medicine into your lungs (inhaler).  Using a vaporizer or a humidifier. These are machines that add water or moisture in the air to help with coughing and poor breathing. Follow these instructions at home:  Activity  Get a lot of rest.  Avoid places where there are fumes from chemicals.  Return to your normal activities as told by your doctor. Ask your doctor what activities are safe for you. Lifestyle  Drink enough fluids to keep your pee (urine) pale yellow.  Do not drink alcohol.  Do not use any products that contain nicotine or tobacco, such as cigarettes, e-cigarettes, and chewing tobacco. If you need help quitting, ask your doctor. Be aware that: ? Your bronchitis will get worse if you smoke or breathe in other people's smoke (secondhand smoke). ? Your lungs will heal faster if you quit smoking. General instructions  Take over-the-counter and prescription medicines only as told by your doctor.  Use an inhaler, cool mist vaporizer, or humidifier as told by your doctor.  Rinse your mouth often with salt water. To make salt water, dissolve -1 tsp (3-6 g) of salt in 1 cup (237 mL) of warm water.  Keep all follow-up visits as told by your doctor. This is important. How is this prevented? To lower your risk of getting this condition again:  Wash your hands often with soap and water. If soap and water are not available, use hand sanitizer.  Avoid contact with people who have cold symptoms.  Try not to touch your mouth, nose, or eyes with your hands.  Make sure to get the flu shot every year. Contact a doctor if:  Your symptoms do not get better in 2 weeks.  You vomit more than once or twice.  You have symptoms of loss of fluid from your body (dehydration). These include: ? Dark urine. ? Dry skin or eyes. ? Increased thirst. ? Headaches. ? Confusion. ? Muscle cramps. Get help right away if:  You cough up blood.  You have chest pain.  You have very bad  shortness of breath.  You become dehydrated.  You faint or keep feeling like you are going to faint.  You keep vomiting.  You have a very bad headache.  Your fever or chills get worse. These symptoms may be an emergency. Do not wait to see if the symptoms will go away. Get medical help right away. Call your local emergency services (911 in the [place]  Do not drive yourself to the hospital. Summary  Acute bronchitis is when air tubes in the lungs (bronchi) suddenly get swollen. In adults, acute bronchitis usually goes away within 2 weeks.  Take over-the-counter and prescription medicines only as told by your doctor.  Drink enough fluid to keep your pee (urine) pale yellow.  Contact a doctor if your symptoms do not improve after 2 weeks of treatment.  Get help right away if you cough up blood, faint, or have chest pain or shortness of breath. This information is not intended to replace advice given to you by your health care provider. Make sure you discuss any questions you have with your health care provider. Document Revised: 07/10/2019 Document Reviewed: 07/10/2019 Elsevier Patient Education  2020 [employer] Inc. Cough, Adult A cough helps to clear your throat and lungs. A cough may be a sign of an illness or another medical condition. An acute cough may only last 2-3 weeks, while a chronic cough may last 8 or more weeks. Many things can cause a cough. They include:  Germs (viruses or bacteria) that attack the airway.  Breathing in things that bother (irritate) your lungs.  Allergies.  Asthma.  Mucus that runs down the back of your throat (postnasal drip).  Smoking.  Acid backing up from the stomach into the tube that moves food from the mouth to the stomach (gastroesophageal reflux).  Some medicines.  Lung problems.  Other medical conditions, such as heart failure or a blood clot in the lung (pulmonary embolism). Follow these instructions at home: Medicines  Take  over-the-counter and prescription medicines only as told by your doctor.  Talk with your doctor before you take medicines that stop a cough (coughsuppressants). Lifestyle   Do not smoke, and try not to be around smoke. Do not use any products that contain nicotine or tobacco, such as cigarettes, e-cigarettes, and chewing tobacco. If you need help quitting, ask your doctor.  Drink enough fluid to keep your pee (urine) pale yellow.  Avoid caffeine.  Do not drink alcohol if your doctor tells you not to drink. General instructions   Watch for any changes in your cough. Tell your doctor about them.  Always cover your mouth when you cough.  Stay away from things that make you cough, such as perfume, candles, campfire smoke, or cleaning products.  If the air is dry, use a cool mist vaporizer or humidifier in your home.  If your cough is worse at night, try using extra pillows to raise your head up higher while you sleep.  Rest as needed.  Keep all follow-up visits as told by your doctor. This is important. Contact a doctor if:  You have new symptoms.  You cough up pus.  Your cough does not get better after 2-3 weeks, or your cough gets worse.  Cough medicine does not help your cough and you are not sleeping well.  You have pain that gets worse or pain that is not helped with medicine.  You have a fever.  You are losing weight and you do not know why.  You have night sweats. Get help right away if:  You cough up blood.  You have trouble breathing.  Your heartbeat is very fast. These symptoms may be an emergency. Do not wait to see if the symptoms will go away. Get medical help right away. Call your local emergency services (911 in the [place] Do not drive yourself to the hospital. Summary  A cough helps to clear  your throat and lungs. Many things can cause a cough.  Take over-the-counter and prescription medicines only as told by your doctor.  Always cover your  mouth when you cough.  Contact a doctor if you have new symptoms or you have a cough that does not get better or gets worse. This information is not intended to replace advice given to you by your health care provider. Make sure you discuss any questions you have with your health care provider. Document Revised: 01/05/2019 Document Reviewed: 01/05/2019 Elsevier Patient Education  2020 [employer] Inc. Sinusitis, Adult Sinusitis is soreness and swelling (inflammation) of your sinuses. Sinuses are hollow spaces in the bones around your face. They are located:  Around your eyes.  In the middle of your forehead.  Behind your nose.  In your cheekbones. Your sinuses and nasal passages are lined with a fluid called mucus. Mucus drains out of your sinuses. Swelling can trap mucus in your sinuses. This lets germs (bacteria, virus, or fungus) grow, which leads to infection. Most of the time, this condition is caused by a virus. What are the causes? This condition is caused by:  Allergies.  Asthma.  Germs.  Things that block your nose or sinuses.  Growths in the nose (nasal polyps).  Chemicals or irritants in the air.  Fungus (rare). What increases the risk? You are more likely to develop this condition if:  You have a weak body defense system (immune system).  You do a lot of swimming or diving.  You use nasal sprays too much.  You smoke. What are the signs or symptoms? The main symptoms of this condition are pain and a feeling of pressure around the sinuses. Other symptoms include:  Stuffy nose (congestion).  Runny nose (drainage).  Swelling and warmth in the sinuses.  Headache.  Toothache.  A cough that may get worse at night.  Mucus that collects in the throat or the back of the nose (postnasal drip).  Being unable to smell and taste.  Being very tired (fatigue).  A fever.  Sore throat.  Bad breath. How is this diagnosed? This condition is diagnosed based  on:  Your symptoms.  Your medical history.  A physical exam.  Tests to find out if your condition is short-term (acute) or long-term (chronic). Your doctor may: ? Check your nose for growths (polyps). ? Check your sinuses using a tool that has a light (endoscope). ? Check for allergies or germs. ? Do imaging tests, such as an MRI or CT scan. How is this treated? Treatment for this condition depends on the cause and whether it is short-term or long-term.  If caused by a virus, your symptoms should go away on their own within 10 days. You may be given medicines to relieve symptoms. They include: ? Medicines that shrink swollen tissue in the nose. ? Medicines that treat allergies (antihistamines). ? A spray that treats swelling of the nostrils. ? Rinses that help get rid of thick mucus in your nose (nasal saline washes).  If caused by bacteria, your doctor may wait to see if you will get better without treatment. You may be given antibiotic medicine if you have: ? A very bad infection. ? A weak body defense system.  If caused by growths in the nose, you may need to have surgery. Follow these instructions at home: Medicines  Take, use, or apply over-the-counter and prescription medicines only as told by your doctor. These may include nasal sprays.  If you were prescribed  an antibiotic medicine, take it as told by your doctor. Do not stop taking the antibiotic even if you start to feel better. Hydrate and humidify   Drink enough water to keep your pee (urine) pale yellow.  Use a cool mist humidifier to keep the humidity level in your home above 50%.  Breathe in steam for 10-15 minutes, 3-4 times a day, or as told by your doctor. You can do this in the bathroom while a hot shower is running.  Try not to spend time in cool or dry air. Rest  Rest as much as you can.  Sleep with your head raised (elevated).  Make sure you get enough sleep each night. General  instructions   Put a warm, moist washcloth on your face 3-4 times a day, or as often as told by your doctor. This will help with discomfort.  Wash your hands often with soap and water. If there is no soap and water, use hand sanitizer.  Do not smoke. Avoid being around people who are smoking (secondhand smoke).  Keep all follow-up visits as told by your doctor. This is important. Contact a doctor if:  You have a fever.  Your symptoms get worse.  Your symptoms do not get better within 10 days. Get help right away if:  You have a very bad headache.  You cannot stop throwing up (vomiting).  You have very bad pain or swelling around your face or eyes.  You have trouble seeing.  You feel confused.  Your neck is stiff.  You have trouble breathing. Summary  Sinusitis is swelling of your sinuses. Sinuses are hollow spaces in the bones around your face.  This condition is caused by tissues in your nose that become inflamed or swollen. This traps germs. These can lead to infection.  If you were prescribed an antibiotic medicine, take it as told by your doctor. Do not stop taking it even if you start to feel better.  Keep all follow-up visits as told by your doctor. This is important. This information is not intended to replace advice given to you by your health care provider. Make sure you discuss any questions you have with your health care provider. Document Revised: 05/19/2018 Document Reviewed: 05/19/2018 Elsevier Patient Education  2020 [employer].

## 2020-11-15 NOTE — Progress Notes (Signed)
.  Virtual Visit via Video Note  I connected with Santa Genera on 11/15/20 at  8:40 AM EST by a video enabled telemedicine application and verified that I am speaking with the correct person using two identifiers.  Location: Patient: at home  Provider: Provider: Provider's office at  [facility], [place].      I discussed the limitations of evaluation and management by telemedicine and the availability of in person appointments. The patient expressed understanding and agreed to proceed.  History of Present Illness:   Patient is a 49 year old female in no acute distress who calls the office for a follow up video visit.  She was seen on 11/10/2020 for chief complaint of sore throat, daughter had positive test and mom was caring for her. Strep test was negative, she was told to discontinue the Augmentin. RSV negative.   Onset of symptoms was 11/06/2020.  She reports she feels different today, she has eye pressure, facial pressure.   She started with cough and congestion this morning.  She has history of bronchitis.  She repeated the rapid test Covid at home was negative.   Green nasal discharge. Denies any short of breath, no loss of taste or smell.  Denies any fevers.   Patient  denies any fever, body aches,chills, rash, chest pain, shortness of breath, nausea, vomiting, or diarrhea.    Observations/Objective:   Patient is alert and oriented and responsive to questions Engages in conversation with provider. Speaks in full sentences without any pauses without any shortness of breath or distress.   Assessment and Plan:  She has symptoms of covid discussed, however has had 2 negative tests,daughter did have strep. She has worsening sinus pressure. Recommendation is to go ahead and restart Augmentin and start Prednisone for cough/ chest tightness/ facial pressure.   She has the prednisone and  Augmentin at home, she will take. Complete course is advised. She has  benzonatate at home. She is taking Mucinex. Increase fluids, warm fluids, rest.  Symptomatic over the counter management discussed.   Self quarantine, until symptoms improving advised.  Follow Up Instructions: Return if symptoms worsen or fail to improve, for at any time for any worsening symptoms, Go to Emergency room/ urgent care if worse.  Red Flags discussed. The patient was given clear instructions to go to ER or return to medical center if any red flags develop, symptoms do not improve, worsen or new problems develop. They verbalized understanding.   I discussed the assessment and treatment plan with the patient. The patient was provided an opportunity to ask questions and all were answered. The patient agreed with the plan and demonstrated an understanding of the instructions.   The patient was advised to call back or seek an in-person evaluation if the symptoms worsen or if the condition fails to improve as anticipated.  I provided 20  minutes of non-face-to-face time during this encounter.   Jairo Ben, FNP   I discussed the limitations of evaluation and management by telemedicine and the availability of in person appointments. The patient expressed understanding and agreed to proceed.\

## 2021-10-02 ENCOUNTER — Other Ambulatory Visit: Payer: Self-pay | Admitting: Family Medicine

## 2021-10-02 DIAGNOSIS — Z1231 Encounter for screening mammogram for malignant neoplasm of breast: Secondary | ICD-10-CM

## 2021-10-17 ENCOUNTER — Ambulatory Visit: Payer: BLUE CROSS/BLUE SHIELD

## 2021-10-20 ENCOUNTER — Telehealth: Payer: Self-pay | Admitting: Physician Assistant

## 2021-10-20 ENCOUNTER — Ambulatory Visit: Payer: Self-pay | Admitting: *Deleted

## 2021-10-20 DIAGNOSIS — B029 Zoster without complications: Secondary | ICD-10-CM

## 2021-10-20 MED ORDER — VALACYCLOVIR HCL 1 G PO TABS: 1000.0000 mg | ORAL_TABLET | Freq: Three times a day (TID) | ORAL | 0 refills | Status: DC

## 2021-10-20 MED ORDER — GABAPENTIN 300 MG PO CAPS: 300.0000 mg | ORAL_CAPSULE | Freq: Two times a day (BID) | ORAL | 0 refills | Status: DC

## 2021-10-20 NOTE — Telephone Encounter (Signed)
Reason for Disposition  [1] Shingles rash AND [2] onset > 72 hours ago (3 days)  Answer Assessment - Initial Assessment Questions 1. APPEARANCE of RASH: "Describe the rash."      Rash/ blisters in clusters 2. LOCATION: "Where is the rash located?"      Across chin  3. ONSET: "When did the rash start?"      Started burning on Wednesday 10/18/21.  4. ITCHING: "Does the rash itch?" If Yes, ask: "How bad is the itch?"  (Scale 1-10; or mild, moderate, severe)     Itching  5. PAIN: "Does the rash hurt?" If Yes, ask: "How bad is the pain?"  (Scale 0-10; or none, mild, moderate, severe)    - NONE (0): no pain    - MILD (1-3): doesn't interfere with normal activities     - MODERATE (4-7): interferes with normal activities or awakens from sleep     - SEVERE (8-10): excruciating pain, unable to do any normal activities     Yes mild  6. OTHER SYMPTOMS: "Do you have any other symptoms?" (e.g., fever)     Denies  7. PREGNANCY: "Is there any chance you are pregnant?" "When was your last menstrual period?"     na  Answer Assessment - Initial Assessment Questions 1. APPEARANCE of RASH: "Describe the rash."      Clusters of blisters across chin.  2. LOCATION: "Where is the rash located?"      Across chin 3. NUMBER: "How many spots are there?"      Approx. 5 spots.  4. SIZE: "How big are the spots?" (Inches, centimeters or compare to size of a coin)      One cluster nickel size 5. ONSET: "When did the rash start?"      1019/22 6. ITCHING: "Does the rash itch?" If Yes, ask: "How bad is the itch?"  (Scale 0-10; or none, mild, moderate, severe)     Yes  7. PAIN: "Does the rash hurt?" If Yes, ask: "How bad is the pain?"  (Scale 0-10; or none, mild, moderate, severe)    - NONE (0): no pain    - MILD (1-3): doesn't interfere with normal activities     - MODERATE (4-7): interferes with normal activities or awakens from sleep     - SEVERE (8-10): excruciating pain, unable to do any normal activities      Mild  8. OTHER SYMPTOMS: "Do you have any other symptoms?" (e.g., fever)     Itching  9. PREGNANCY: "Is there any chance you are pregnant?" "When was your last menstrual period?"     na  Protocols used: Rash or Redness - Localized-A-AH, Shingles (Zoster)-A-AH

## 2021-10-20 NOTE — Telephone Encounter (Signed)
FYI

## 2021-10-20 NOTE — Addendum Note (Signed)
Addended by: Margaretann Loveless on: 10/20/2021 03:55 PM   Modules accepted: Orders

## 2021-10-20 NOTE — Telephone Encounter (Signed)
C/o rash/ blisters across chin. Burinng sensation started on chin Wednesday 10/18/21 . Blisters noted in clusters size of nickel across chin. 5 spots at this time. C/o being stressed  and tired. Denies fever or drainage from blisters . No available appt noted until Nov. Instructed to try E- visit on [place] website. Patient requesting medication to help with burning pain.  Please advise. Care advise given. Patient verbalized understanding of care advise and to go to [facility] or ED now for evaluation.

## 2021-10-20 NOTE — Progress Notes (Signed)
E-visit for Shingles   We are sorry that you are not feeling well. Here is how we plan to help!  Based on what you shared with me it looks like you have shingles.  Shingles or herpes zoster, is a common infection of the nerves.  It is a painful rash caused by the herpes zoster virus.  This is the same virus that causes chickenpox.  After a person has chickenpox, the virus remains inactive in the nerve cells.  Years later, the virus can become active again and travel to the skin.  It typically will appear on one side of the face or body.  Burning or shooting pain, tingling, or itching are early signs of the infection.  Blisters typically scab over in 7 to 10 days and clear up within 2-4 weeks. Shingles is only contagious to people that have never had the chickenpox, the chickenpox vaccine, or anyone who has a compromised immune system.  You should avoid contact with these type of people until your blisters scab over.  I have prescribed Valacyclovir 1g three times daily for 7 days and also Gabapentin 300mg twice daily as needed for pain   HOME CARE: Apply ice packs (wrapped in a thin towel), cool compresses, or soak in cool bath to help reduce pain. Use calamine lotion to calm itchy skin. Avoid scratching the rash. Avoid direct sunlight.  GET HELP RIGHT AWAY IF: Symptoms that don't away after treatment. A rash or blisters near your eye. Increased drainage, fever, or rash after treatment. Severe pain that doesn't go away.   MAKE SURE YOU   Understand these instructions. Will watch your condition. Will get help right away if you are not doing well or get worse.  Thank you for choosing an e-visit.  Your e-visit answers were reviewed by a board certified advanced clinical practitioner to complete your personal care plan. Depending upon the condition, your plan could have included both over the counter or prescription medications.  Please review your pharmacy choice. Make sure the pharmacy  is open so you can pick up prescription now. If there is a problem, you may contact your provider through MyChart messaging and have the prescription routed to another pharmacy.  Your safety is important to us. If you have drug allergies check your prescription carefully.   For the next 24 hours you can use MyChart to ask questions about today's visit, request a non-urgent call back, or ask for a work or school excuse. You will get an email in the next two days asking about your experience. I hope that your e-visit has been valuable and will speed your recovery.  I provided 5 minutes of non face-to-face time during this encounter for chart review and documentation.   

## 2021-11-06 ENCOUNTER — Ambulatory Visit: Payer: Self-pay

## 2022-01-02 ENCOUNTER — Ambulatory Visit: Payer: Self-pay

## 2022-10-11 ENCOUNTER — Telehealth: Payer: 59 | Admitting: Family Medicine

## 2022-10-11 DIAGNOSIS — B029 Zoster without complications: Secondary | ICD-10-CM

## 2022-10-11 MED ORDER — GABAPENTIN 300 MG PO CAPS: 300.0000 mg | ORAL_CAPSULE | Freq: Two times a day (BID) | ORAL | 0 refills | Status: AC

## 2022-10-11 MED ORDER — VALACYCLOVIR HCL 1 G PO TABS: 1000.0000 mg | ORAL_TABLET | Freq: Three times a day (TID) | ORAL | 0 refills | Status: AC

## 2022-10-11 NOTE — Progress Notes (Signed)
E-visit for Shingles   We are sorry that you are not feeling well. Here is how we plan to help!  Based on what you shared with me it looks like you have shingles.  Shingles or herpes zoster, is a common infection of the nerves.  It is a painful rash caused by the herpes zoster virus.  This is the same virus that causes chickenpox.  After a person has chickenpox, the virus remains inactive in the nerve cells.  Years later, the virus can become active again and travel to the skin.  It typically will appear on one side of the face or body.  Burning or shooting pain, tingling, or itching are early signs of the infection.  Blisters typically scab over in 7 to 10 days and clear up within 2-4 weeks. Shingles is only contagious to people that have never had the chickenpox, the chickenpox vaccine, or anyone who has a compromised immune system.  You should avoid contact with these type of people until your blisters scab over.  I have prescribed Valacyclovir 1g three times daily for 7 days and also Gabapentin 300mg twice daily as needed for pain   HOME CARE: Apply ice packs (wrapped in a thin towel), cool compresses, or soak in cool bath to help reduce pain. Use calamine lotion to calm itchy skin. Avoid scratching the rash. Avoid direct sunlight.  GET HELP RIGHT AWAY IF: Symptoms that don't away after treatment. A rash or blisters near your eye. Increased drainage, fever, or rash after treatment. Severe pain that doesn't go away.   MAKE SURE YOU   Understand these instructions. Will watch your condition. Will get help right away if you are not doing well or get worse.  Thank you for choosing an e-visit.  Your e-visit answers were reviewed by a board certified advanced clinical practitioner to complete your personal care plan. Depending upon the condition, your plan could have included both over the counter or prescription medications.  Please review your pharmacy choice. Make sure the pharmacy  is open so you can pick up prescription now. If there is a problem, you may contact your provider through MyChart messaging and have the prescription routed to another pharmacy.  Your safety is important to us. If you have drug allergies check your prescription carefully.   For the next 24 hours you can use MyChart to ask questions about today's visit, request a non-urgent call back, or ask for a work or school excuse. You will get an email in the next two days asking about your experience. I hope that your e-visit has been valuable and will speed your recovery.   I provided 5 minutes of non face-to-face time during this encounter for chart review, medication and order placement, as well as and documentation.   

## 2022-10-13 ENCOUNTER — Telehealth: Payer: 59 | Admitting: Physician Assistant

## 2022-10-13 ENCOUNTER — Inpatient Hospital Stay
Admission: RE | Admit: 2022-10-13 | Discharge: 2022-10-13 | Disposition: A | Discharge status: Home / Self Care | Payer: 59 | Source: Ambulatory Visit | Attending: Family Medicine | Admitting: Family Medicine

## 2022-10-13 DIAGNOSIS — R21 Rash and other nonspecific skin eruption: Secondary | ICD-10-CM

## 2022-10-13 NOTE — Progress Notes (Signed)
Because you have had symptoms worsening and not responding to first-line treatments, I feel your condition warrants further evaluation and I recommend that you be seen in a face to face visit.   NOTE: There will be NO CHARGE for this eVisit   If you are having a true medical emergency please call 911.      For an urgent face to face visit, [facility] has seven urgent care centers for your convenience:     [facility] [place] Get Driving Directions [phone] [address] [place], [place] [address]    [facility]) Get Driving Directions [phone] [place], [place] [address]  [facility]) Get Driving Directions [phone] [address] [place],  [place]  [address]  [facility] - at [address] Get Driving Directions  [phone] [address] [place],  [place] [address]   [facility] Get Driving Directions [phone] [address] [place], [place], [place] [address]   [facility] Get Driving Directions  [phone] [address] [place], [place] [address]   [facility] [place] Get Driving Directions [phone] [address] [place], [place] [address]  Your MyChart E-visit questionnaire answers were reviewed by a board certified advanced clinical practitioner to complete your personal care plan based on your specific symptoms.  Thank you for using e-Visits.    I have spent 5 minutes in review of e-visit questionnaire, review and updating patient chart, medical decision making and response to patient.   Mar Daring, PA-C

## 2023-02-19 ENCOUNTER — Telehealth: Payer: 59 | Admitting: Physician Assistant

## 2023-02-19 DIAGNOSIS — J208 Acute bronchitis due to other specified organisms: Secondary | ICD-10-CM

## 2023-02-19 DIAGNOSIS — B9689 Other specified bacterial agents as the cause of diseases classified elsewhere: Secondary | ICD-10-CM

## 2023-02-19 MED ORDER — AZITHROMYCIN 250 MG PO TABS
ORAL_TABLET | ORAL | 0 refills | Status: AC
Start: 2023-02-19 — End: 2023-02-24

## 2023-02-19 MED ORDER — BENZONATATE 100 MG PO CAPS: 100.0000 mg | ORAL_CAPSULE | Freq: Three times a day (TID) | ORAL | 0 refills | Status: AC | PRN

## 2023-02-19 NOTE — Progress Notes (Signed)
We are sorry that you are not feeling well.  Here is how we plan to help!  Based on your presentation I believe you most likely have A cough due to bacteria.  When patients have a fever and a productive cough with a change in color or increased sputum production, we are concerned about bacterial bronchitis.  If left untreated it can progress to pneumonia.  If your symptoms do not improve with your treatment plan it is important that you contact your provider.   I have prescribed Azithromyin 250 mg: two tablets now and then one tablet daily for 4 additonal days    In addition you may use A non-prescription cough medication called Mucinex DM: take 2 tablets every 12 hours. and A prescription cough medication called Tessalon Perles 154m. You may take 1-2 capsules every 8 hours as needed for your cough.  From your responses in the eVisit questionnaire you describe inflammation in the upper respiratory tract which is causing a significant cough.  This is commonly called Bronchitis and has four common causes:   Allergies Viral Infections Acid Reflux Bacterial Infection Allergies, viruses and acid reflux are treated by controlling symptoms or eliminating the cause. An example might be a cough caused by taking certain blood pressure medications. You stop the cough by changing the medication. Another example might be a cough caused by acid reflux. Controlling the reflux helps control the cough.  USE OF BRONCHODILATOR ("RESCUE") INHALERS: There is a risk from using your bronchodilator too frequently.  The risk is that over-reliance on a medication which only relaxes the muscles surrounding the breathing tubes can reduce the effectiveness of medications prescribed to reduce swelling and congestion of the tubes themselves.  Although you feel brief relief from the bronchodilator inhaler, your asthma may actually be worsening with the tubes becoming more swollen and filled with mucus.  This can delay other crucial  treatments, such as oral steroid medications. If you need to use a bronchodilator inhaler daily, several times per day, you should discuss this with your provider.  There are probably better treatments that could be used to keep your asthma under control.     HOME CARE Only take medications as instructed by your medical team. Complete the entire course of an antibiotic. Drink plenty of fluids and get plenty of rest. Avoid close contacts especially the very young and the elderly Cover your mouth if you cough or cough into your sleeve. Always remember to wash your hands A steam or ultrasonic humidifier can help congestion.   GET HELP RIGHT AWAY IF: You develop worsening fever. You become short of breath You cough up blood. Your symptoms persist after you have completed your treatment plan MAKE SURE YOU  Understand these instructions. Will watch your condition. Will get help right away if you are not doing well or get worse.    Thank you for choosing an e-visit.  Your e-visit answers were reviewed by a board certified advanced clinical practitioner to complete your personal care plan. Depending upon the condition, your plan could have included both over the counter or prescription medications.  Please review your pharmacy choice. Make sure the pharmacy is open so you can pick up prescription now. If there is a problem, you may contact your provider through [employer] have the prescription routed to another pharmacy.  Your safety is important to [place] If you have drug allergies check your prescription carefully.   For the next 24 hours you can use MyChart  to ask questions about today's visit, request a non-urgent call back, or ask for a work or school excuse. You will get an email in the next two days asking about your experience. I hope that your e-visit has been valuable and will speed your recovery.  I have spent 5 minutes in review of e-visit questionnaire, review and updating  patient chart, medical decision making and response to patient.   Mar Daring, PA-C

## 2024-12-11 ENCOUNTER — Other Ambulatory Visit: Payer: Self-pay | Admitting: Family Medicine

## 2024-12-11 DIAGNOSIS — Z1231 Encounter for screening mammogram for malignant neoplasm of breast: Secondary | ICD-10-CM

## 2025-01-26 ENCOUNTER — Telehealth: Admitting: Emergency Medicine

## 2025-01-26 DIAGNOSIS — L0100 Impetigo, unspecified: Secondary | ICD-10-CM

## 2025-01-26 MED ORDER — MUPIROCIN 2 % EX OINT: 1.0000 | TOPICAL_OINTMENT | Freq: Two times a day (BID) | CUTANEOUS | 0 refills | Status: AC

## 2025-01-26 NOTE — Progress Notes (Signed)
 E Visit for Cellulitis  We are sorry that you are not feeling well. Here is how we plan to help!  Based on what you shared, it appears you may have a skin infection. I suspect your skin became raw from all of the nose blowing and bacteria may have gotten under your skin.    Cellulitis is a bacterial skin infection that typically presents with redness, swelling, warmth, and tenderness in the affected area. Small red spots, minor bleeding under the skin, and fluid-filled blisters may also appear. Fever can sometimes accompany the infection.    Cellulitis usually affects only one side of the body, with the lower limbs being the most commonly involved area  I have prescribed:  mupirocin  ointment to use on your nose/infected areas. At this point, I do not think you need pill antibiotics but if this treatment doesn't help you heal or if your skin infection is spreading/getting worse, you may need oral antibiotics.   HOME CARE:  Take all medications as prescribed and be sure to complete the full course - even if your skin appears to be improving.   GET HELP RIGHT AWAY IF:  Symptoms do not begin to improve within 48 hours. Severe redness persists or worsens. If the area turns color, spreads or swells. If it blisters and opens, develops yellow-brown crust or bleeds. You develop a fever or chills. If the pain increases or becomes unbearable.  Are unable to keep fluids and food down.  MAKE SURE YOU   Understand these instructions. Will watch your condition. Will get help right away if you are not doing well or get worse.  Thank you for choosing an e-visit.   Your e-visit answers were reviewed by a board certified advanced clinical practitioner to complete your personal care plan. Depending upon the condition, your plan could have included both over the counter or prescription medications.   Please review your pharmacy choice. Make sure the pharmacy is open so you can pick up the prescription  now. If there is a problem, you may contact your provider through [employer] and have the prescription routed to another pharmacy.   Your safety is important to us . If you have drug allergies, check your prescription carefully.   For the next 24 hours you can use MyChart to ask questions about today's visit, request a non-urgent call back, or ask for a work or school excuse.   You will receive an email in the next two days asking about your experience. I hope that your e-visit has been valuable and will speed up your recovery.   I have spent 5 minutes in review of e-visit questionnaire, review and updating patient chart, medical decision making and response to patient.   Jon Belt, PhD, FNP-BC
